# Patient Record
Sex: Male | Born: 1960 | Race: Black or African American | Hispanic: No | Marital: Single | Smoking: Never smoker
Health system: Southern US, Community
[De-identification: ages and names within clinical notes are randomized; demographics above are authoritative.]

## PROBLEM LIST (undated history)

## (undated) DIAGNOSIS — J309 Allergic rhinitis, unspecified: Secondary | ICD-10-CM

## (undated) DIAGNOSIS — D649 Anemia, unspecified: Secondary | ICD-10-CM

## (undated) DIAGNOSIS — I10 Essential (primary) hypertension: Secondary | ICD-10-CM

## (undated) HISTORY — PX: CHOLECYSTECTOMY: SHX55

## (undated) HISTORY — DX: Allergic rhinitis, unspecified: J30.9

## (undated) HISTORY — DX: Anemia, unspecified: D64.9

---

## 2005-04-02 ENCOUNTER — Other Ambulatory Visit: Payer: Self-pay

## 2005-04-02 ENCOUNTER — Inpatient Hospital Stay: Payer: Self-pay

## 2016-02-14 ENCOUNTER — Ambulatory Visit
Admission: RE | Admit: 2016-02-14 | Payer: BC Managed Care – PPO | Source: Ambulatory Visit | Admitting: Gastroenterology

## 2016-02-14 ENCOUNTER — Encounter: Admission: RE | Payer: Self-pay | Source: Ambulatory Visit

## 2016-02-14 SURGERY — COLONOSCOPY WITH PROPOFOL
Anesthesia: General

## 2020-05-30 ENCOUNTER — Other Ambulatory Visit: Payer: Self-pay

## 2020-05-30 ENCOUNTER — Ambulatory Visit
Admission: EM | Admit: 2020-05-30 | Discharge: 2020-05-30 | Disposition: A | Discharge status: Home / Self Care | Payer: BC Managed Care – PPO | Attending: Family Medicine | Admitting: Family Medicine

## 2020-05-30 DIAGNOSIS — R197 Diarrhea, unspecified: Secondary | ICD-10-CM | POA: Diagnosis not present

## 2020-05-30 HISTORY — DX: Essential (primary) hypertension: I10

## 2020-05-30 NOTE — Discharge Instructions (Addendum)
This will most likely resolve on its own with time.  Make sure you are staying hydrated.  You can do over-the-counter Imodium as needed. We will obtain a stool sample and tested for Covid to be sure Information given a packet on foods that help relieve diarrhea Follow up as needed for continued or worsening symptoms

## 2020-05-30 NOTE — ED Triage Notes (Signed)
Pt presents to Urgent Care with c/o diarrhea and chills x 2 days. Denies abdominal pain and N/V. Reports headache initially, but has subsided.

## 2020-05-30 NOTE — ED Provider Notes (Signed)
Thomas Powell    CSN: [id number] Arrival date & time: 05/30/20  0815      History   Chief Complaint Chief Complaint  Patient presents with   Diarrhea    HPI Thomas Powell. is a 59 y.o. male.   Patient is a 59 year old male with past medical history of hypertension.  He presents today with approximately 2 days of diarrhea, chills, headache.  The headache has resolved.  He denies any CT abdominal pain, nausea or vomiting.  Has had 3-4 episodes of loose stools/watery diarrhea daily.  No blood in stool.  No recent traveling or recent antibiotic use. No fever.      Past Medical History:  Diagnosis Date   Hypertension     There are no problems to display for this patient.   Past Surgical History:  Procedure Laterality Date   CHOLECYSTECTOMY         Home Medications    Prior to Admission medications   Medication Sig Start Date End Date Taking? Authorizing Provider  Multiple Vitamin (MULTIVITAMIN WITH MINERALS) TABS tablet Take 1 tablet by mouth daily.   Yes [provider]    Family History Family History  Problem Relation Age of Onset   Hypertension Mother    Anemia Father     Social History Social History   Tobacco Use   Smoking status: Never Smoker   Smokeless tobacco: Never Used  Substance Use Topics   Alcohol use: Not Currently   Drug use: Never     Allergies   Hctz [hydrochlorothiazide]   Review of Systems Review of Systems   Physical Exam Triage Vital Signs ED Triage Vitals  Enc Vitals Group     BP 05/30/20 0827 132/84     Pulse Rate 05/30/20 0827 (!) 102     Resp 05/30/20 0827 16     Temp 05/30/20 0827 98.2 F (36.8 C)     Temp Source 05/30/20 0827 Oral     SpO2 05/30/20 0827 96 %     Weight 05/30/20 0829 255 lb (115.7 kg)     Height 05/30/20 0829 6\' 1"  (1.854 m)     Head Circumference --      Peak Flow --      Pain Score 05/30/20 0830 0     Pain Loc --      Pain Edu? --      Excl. in GC? --     No data found.  Updated Vital Signs BP 132/84 (BP Location: Left Arm)    Pulse (!) 102    Temp 98.2 F (36.8 C) (Oral)    Resp 16    Ht 6\' 1"  (1.854 m)    Wt 255 lb (115.7 kg)    SpO2 96%    BMI 33.64 kg/m   Visual Acuity Right Eye Distance:   Left Eye Distance:   Bilateral Distance:    Right Eye Near:   Left Eye Near:    Bilateral Near:     Physical Exam Vitals and nursing note reviewed.  Constitutional:      General: He is not in acute distress.    Appearance: Normal appearance. He is not ill-appearing, toxic-appearing or diaphoretic.  HENT:     Head: Normocephalic and atraumatic.     Nose: Nose normal.  Eyes:     Conjunctiva/sclera: Conjunctivae normal.  Pulmonary:     Effort: Pulmonary effort is normal.  Musculoskeletal:        General: Normal range  of motion.     Cervical back: Normal range of motion.  Skin:    General: Skin is warm.  Neurological:     Mental Status: He is alert.  Psychiatric:        Mood and Affect: Mood normal.      UC Treatments / Results  Labs (all labs ordered are listed, but only abnormal results are displayed) Labs Reviewed - No data to display  EKG   Radiology No results found.  Procedures Procedures (including critical care time)  Medications Ordered in UC Medications - No data to display  Initial Impression / Assessment and Plan / UC Course  I have reviewed the triage vital signs and the nursing notes.  Pertinent labs & imaging results that were available during my care of the patient were reviewed by me and considered in my medical decision making (see chart for details).     Diarrhea Most likely viral versus something he ingested. Will obtain stool sample.  Will test for Covid. Recommend stay hydrated with water and Gatorade to replenish electrolytes. Recommended Imodium over-the-counter as needed Follow up as needed for continued or worsening symptoms  Final Clinical Impressions(s) / UC Diagnoses   Final  diagnoses:  Diarrhea of presumed infectious origin     Discharge Instructions     This will most likely resolve on its own with time.  Make sure you are staying hydrated.  You can do over-the-counter Imodium as needed. We will obtain a stool sample and tested for Covid to be sure Information given a packet on foods that help relieve diarrhea Follow up as needed for continued or worsening symptoms     ED Prescriptions    None     PDMP not reviewed this encounter.   Janace Aris, NP 05/30/20 [phone]

## 2020-05-31 LAB — GASTROINTESTINAL PANEL BY PCR, STOOL (REPLACES STOOL CULTURE)

## 2020-06-01 LAB — COVID-19, FLU A+B AND RSV
Influenza A, NAA: NOT DETECTED
Influenza B, NAA: NOT DETECTED
RSV, NAA: NOT DETECTED
SARS-CoV-2, NAA: NOT DETECTED

## 2021-12-10 ENCOUNTER — Other Ambulatory Visit: Payer: Self-pay | Admitting: Family Medicine

## 2021-12-10 DIAGNOSIS — R42 Dizziness and giddiness: Secondary | ICD-10-CM

## 2021-12-24 ENCOUNTER — Ambulatory Visit
Admission: RE | Admit: 2021-12-24 | Discharge: 2021-12-24 | Disposition: A | Discharge status: Home / Self Care | Payer: BC Managed Care – PPO | Source: Ambulatory Visit | Attending: Family Medicine | Admitting: Family Medicine

## 2021-12-24 DIAGNOSIS — R42 Dizziness and giddiness: Secondary | ICD-10-CM | POA: Insufficient documentation

## 2022-05-15 ENCOUNTER — Encounter: Payer: Self-pay | Admitting: Emergency Medicine

## 2022-05-15 ENCOUNTER — Other Ambulatory Visit: Payer: Self-pay

## 2022-05-15 ENCOUNTER — Emergency Department
Admission: EM | Admit: 2022-05-15 | Discharge: 2022-05-15 | Disposition: A | Discharge status: Home / Self Care | Payer: BC Managed Care – PPO | Attending: Emergency Medicine | Admitting: Emergency Medicine

## 2022-05-15 ENCOUNTER — Emergency Department: Payer: BC Managed Care – PPO

## 2022-05-15 DIAGNOSIS — R1012 Left upper quadrant pain: Secondary | ICD-10-CM | POA: Insufficient documentation

## 2022-05-15 DIAGNOSIS — I10 Essential (primary) hypertension: Secondary | ICD-10-CM | POA: Diagnosis not present

## 2022-05-15 DIAGNOSIS — M545 Low back pain, unspecified: Secondary | ICD-10-CM | POA: Insufficient documentation

## 2022-05-15 DIAGNOSIS — R109 Unspecified abdominal pain: Secondary | ICD-10-CM

## 2022-05-15 LAB — HEPATIC FUNCTION PANEL
ALT: 21 U/L (ref 0–44)
AST: 22 U/L (ref 15–41)
Albumin: 3.9 g/dL (ref 3.5–5.0)
Alkaline Phosphatase: 69 U/L (ref 38–126)
Bilirubin, Direct: <0.1 mg/dL (ref 0.0–0.2)
Total Bilirubin: 0.5 mg/dL (ref 0.3–1.2)
Total Protein: 6.8 g/dL (ref 6.5–8.1)

## 2022-05-15 LAB — URINALYSIS, ROUTINE W REFLEX MICROSCOPIC
Bilirubin Urine: NEGATIVE
Glucose, UA: NEGATIVE mg/dL
Hgb urine dipstick: NEGATIVE
Ketones, ur: NEGATIVE mg/dL
Leukocytes,Ua: NEGATIVE
Nitrite: NEGATIVE
Protein, ur: NEGATIVE mg/dL
Specific Gravity, Urine: 1.026 (ref 1.005–1.030)
pH: 6 (ref 5.0–8.0)

## 2022-05-15 LAB — CBC
HCT: 40.6 % (ref 39.0–52.0)
Hemoglobin: 13.2 g/dL (ref 13.0–17.0)
MCH: 24.3 pg — ABNORMAL LOW (ref 26.0–34.0)
MCHC: 32.5 g/dL (ref 30.0–36.0)
MCV: 74.6 fL — ABNORMAL LOW (ref 80.0–100.0)
Platelets: 195 10*3/uL (ref 150–400)
RBC: 5.44 MIL/uL (ref 4.22–5.81)
RDW: 13.7 % (ref 11.5–15.5)
WBC: 3.9 10*3/uL — ABNORMAL LOW (ref 4.0–10.5)
nRBC: 0 % (ref 0.0–0.2)

## 2022-05-15 LAB — BASIC METABOLIC PANEL
Anion gap: 7 (ref 5–15)
BUN: 20 mg/dL (ref 6–20)
CO2: 27 mmol/L (ref 22–32)
Calcium: 9.1 mg/dL (ref 8.9–10.3)
Chloride: 107 mmol/L (ref 98–111)
Creatinine, Ser: 1.35 mg/dL — ABNORMAL HIGH (ref 0.61–1.24)
GFR, Estimated: >60 mL/min (ref >60)
Glucose, Bld: 144 mg/dL — ABNORMAL HIGH (ref 70–99)
Potassium: 4.2 mmol/L (ref 3.5–5.1)
Sodium: 141 mmol/L (ref 135–145)

## 2022-05-15 LAB — LIPASE, BLOOD: Lipase: 26 U/L (ref 11–51)

## 2022-05-15 MED ORDER — CYCLOBENZAPRINE HCL 5 MG PO TABS: 5.0000 mg | ORAL_TABLET | Freq: Three times a day (TID) | ORAL | 0 refills | Status: DC | PRN

## 2022-05-15 MED ORDER — LIDOCAINE 5 % EX PTCH: 1.0000 | MEDICATED_PATCH | Freq: Two times a day (BID) | CUTANEOUS | 0 refills | Status: AC

## 2022-05-15 MED ORDER — KETOROLAC TROMETHAMINE 30 MG/ML IJ SOLN
15.0000 mg | Freq: Once | INTRAMUSCULAR | Status: AC
  Administered 2022-05-15: 15 mg via INTRAVENOUS
  Filled 2022-05-15: qty 1

## 2022-05-15 MED ORDER — LACTATED RINGERS IV BOLUS
1000.0000 mL | Freq: Once | INTRAVENOUS | Status: AC
  Administered 2022-05-15: 1000 mL via INTRAVENOUS

## 2022-05-15 NOTE — ED Notes (Signed)
Patient transported to CT 

## 2022-05-15 NOTE — ED Triage Notes (Signed)
Pt to ED via POV stating that he has been having stabbing back pain that radiates around into his left flank. Pt states that this has been going on for a week. Pt has taken OTC medications without relief. Pt states that he was seen at Urgent Care 2 days ago and had blood work done. Pt is in NAD.

## 2022-05-15 NOTE — ED Provider Notes (Signed)
[facility] Provider Note    Event Date/Time   First MD Initiated Contact with Patient 05/15/22 0801     (approximate)   History   Chief Complaint Back Pain and Flank Pain   HPI  Thomas Powell. is a 61 y.o. male with past medical history of hypertension and cholecystectomy who presents to the ED complaining of flank pain.  Patient reports that he was lifting weights about 1 week ago when he had onset of pain in his left lower back.  He describes the pain as sharp and stabbing, initially seemed to improve with over-the-counter medications.  He took a break from working out, however pain seemed to worsen over the course of this week.  Pain now seems to come from his left flank and radiated around to the left upper quadrant of his abdomen.  Pain is intermittent and not exacerbated or alleviated by anything in particular.  He denies any associated nausea or vomiting, has been having normal bowel movements.  He denies any dysuria, hematuria, or fevers.  He denies any history of similar symptoms, denies any history of kidney stones.     Physical Exam   Triage Vital Signs: ED Triage Vitals  Enc Vitals Group     BP 05/15/22 0800 (!) 157/86     Pulse Rate 05/15/22 0800 90     Resp 05/15/22 0800 16     Temp 05/15/22 0800 97.7 F (36.5 C)     Temp Source 05/15/22 0800 Oral     SpO2 05/15/22 0800 98 %     Weight --      Height --      Head Circumference --      Peak Flow --      Pain Score 05/15/22 0758 6     Pain Loc --      Pain Edu? --      Excl. in GC? --     Most recent vital signs: Vitals:   05/15/22 0800  BP: (!) 157/86  Pulse: 90  Resp: 16  Temp: 97.7 F (36.5 C)  SpO2: 98%    Constitutional: Alert and oriented. Eyes: Conjunctivae are normal. Head: Atraumatic. Nose: No congestion/rhinnorhea. Mouth/Throat: Mucous membranes are moist.  Cardiovascular: Normal rate, regular rhythm. Grossly normal heart sounds.  2+ radial pulses  bilaterally. Respiratory: Normal respiratory effort.  No retractions. Lungs CTAB. Gastrointestinal: Soft and nontender.  No CVA tenderness bilaterally.  No distention. Musculoskeletal: No lower extremity tenderness nor edema.  Neurologic:  Normal speech and language. No gross focal neurologic deficits are appreciated.    ED Results / Procedures / Treatments   Labs (all labs ordered are listed, but only abnormal results are displayed) Labs Reviewed  URINALYSIS, ROUTINE W REFLEX MICROSCOPIC - Abnormal; Notable for the following components:      Result Value   Color, Urine YELLOW (*)    APPearance CLEAR (*)    All other components within normal limits  BASIC METABOLIC PANEL - Abnormal; Notable for the following components:   Glucose, Bld 144 (*)    Creatinine, Ser 1.35 (*)    All other components within normal limits  CBC - Abnormal; Notable for the following components:   WBC 3.9 (*)    MCV 74.6 (*)    MCH 24.3 (*)    All other components within normal limits  HEPATIC FUNCTION PANEL  LIPASE, BLOOD   RADIOLOGY CT renal protocol reviewed and interpreted by me with no ureteral stone,  inflammatory changes, or focal fluid collection noted.  PROCEDURES:  Critical Care performed: No  Procedures   MEDICATIONS ORDERED IN ED: Medications  lactated ringers bolus 1,000 mL (1,000 mLs Intravenous New Bag/Given 05/15/22 0935)  ketorolac (TORADOL) 30 MG/ML injection 15 mg (15 mg Intravenous Given 05/15/22 0933)     IMPRESSION / MDM / ASSESSMENT AND PLAN / ED COURSE  I reviewed the triage vital signs and the nursing notes.                              61 y.o. male with past medical history of hypertension and cholecystectomy who presents to the ED complaining of about 1 week of intermittent sharp and stabbing pain in his left flank radiating towards the left upper quadrant of his abdomen.  Patient's presentation is most consistent with acute presentation with potential threat to life  or bodily function.  Differential diagnosis includes, but is not limited to, kidney stone, pyelonephritis, shingles, gastritis, muscle strain, radiculopathy.  Patient well-appearing and in no acute distress, vital signs are unremarkable.  I am not able to reproduce his pain in either his abdomen or costovertebral area.  Plan to further assess with CT imaging to rule out kidney stone or other intra-abdominal process.  No rash on exam to raise suspicion for shingles, will check urinalysis but he denies any urinary symptoms.  Plan to treat symptomatically with IV Toradol and reassess.  CT renal protocol is unremarkable, urinalysis shows no signs of infection.  Labs are also reassuring with no significant anemia, leukocytosis, electrolyte abnormality, or AKI.  LFTs and lipase are unremarkable.  Given reassuring work-up, suspect symptoms are due to muscle strain versus radiculopathy and he is appropriate for management as an outpatient.  He will be prescribed muscle relaxant and Lidoderm patches, was counseled to take Tylenol or ibuprofen as needed in addition to this.  He was counseled to follow-up with his PCP and to return to the ED for new or worsening symptoms, patient agrees with plan.      FINAL CLINICAL IMPRESSION(S) / ED DIAGNOSES   Final diagnoses:  Flank pain  Acute left-sided low back pain without sciatica     Rx / DC Orders   ED Discharge Orders          Ordered    lidocaine (LIDODERM) 5 %  Every 12 hours        05/15/22 1017    cyclobenzaprine (FLEXERIL) 5 MG tablet  3 times daily PRN        05/15/22 1017             Note:  This document was prepared using Dragon voice recognition software and may include unintentional dictation errors.   Blake Divine, MD 05/15/22 1019

## 2023-08-22 DIAGNOSIS — E119 Type 2 diabetes mellitus without complications: Secondary | ICD-10-CM | POA: Insufficient documentation

## 2023-08-22 DIAGNOSIS — I10 Essential (primary) hypertension: Secondary | ICD-10-CM | POA: Insufficient documentation

## 2023-08-22 NOTE — Progress Notes (Signed)
 Thomas Powell T. Kholton Coate, MD, CAQ Sports Medicine [facility] [address] [place], [address]  Phone: [phone]  FAX: [phone]  Thomas Powell. - 63 y.o. male  MRN [id number]  Date of Birth: [birth year]  Date: 08/23/2023  PCP: Thomas San, MD  Referral: Thomas San, MD  Chief Complaint  Patient presents with   Establish Care   Arm Pain    Left    Subjective:   Thomas Powell. is a 62 y.o. very pleasant male patient with Body mass index is 33.98 kg/m. who presents with the following:  He is here for a new patient appointment.  It is not clear to me if he is here for a new sports medicine consult or a new patient primary care visit.  He is not sure.  He may return to [place] [facility].  At this point, he has a deep dull ache in the shoulder that is been present for roughly 2 to 3 months.  He has restriction of motion, pain at night, and globally some pain from the shoulder blade all the way down to the elbow.    Relevant history includes type 2 diabetes.  He thought that he had prediabetes.  I reviewed his lab work with him.  Has worked out for a long time.  Normally will do weights Treadmill for an extended period of time.   About a month or so ago, would get home, and feel some pain with ABD and IROM  Shoulder to upper arm Did not do anything that he can think of it to make it worse Will do some hangs, cannot do that at all right now Cannot really sleep on the L arm He cannot do any pressing, bench press, overhead pressing  No injury at all  Review of Systems is noted in the HPI, as appropriate  Patient Active Problem List   Diagnosis Date Noted   Type 2 diabetes mellitus without complication, without long-term current use of insulin (HCC) 08/22/2023   Essential hypertension 08/22/2023    Past Medical History:  Diagnosis Date   Allergic rhinitis    Anemia    Essential hypertension 08/22/2023   Type 2  diabetes mellitus without complication, without long-term current use of insulin (HCC) 08/22/2023    Past Surgical History:  Procedure Laterality Date   CHOLECYSTECTOMY      Family History  Problem Relation Age of Onset   Hypertension Mother    Anemia Father    Diabetes Maternal Aunt    Diabetes Sister    Drug abuse Brother     Social History   Social History Narrative   Not on file     Objective:   BP (!) 126/90 (BP Location: Right Arm, Patient Position: Sitting, Cuff Size: Large)   Pulse 93   Temp 97.6 F (36.4 C) (Temporal)   Ht 5' 11.25" (1.81 m)   Wt 245 lb 6 oz (111.3 kg)   SpO2 93%   BMI 33.98 kg/m   GEN: No acute distress; alert,appropriate. PULM: Breathing comfortably in no respiratory distress PSYCH: Normally interactive.    Shoulder: R and L Inspection: No muscle wasting or winging Ecchymosis/edema: neg  AC joint, scapula, clavicle: NT Cervical spine: NT, full ROM Spurling's: neg ABNORMAL SIDE TESTED: L UNLESS OTHERWISE NOTED, THE CONTRALATERAL SIDE HAS FULL RANGE OF MOTION. Abduction: 5/5, LIMITED TO 110 DEGREES Flexion: 5/5, LIMITED TO 110 DEGNO ROM  IR, lift-off: 5/5. TESTED AT 90 DEGREES  OF ABDUCTION, LIMITED TO 0 DEGREES ER at neutral:  5/5, TESTED AT 90 DEGREES OF ABDUCTION, LIMITED TO 10 DEGREES AC crossover and compression: PAIN Drop Test: neg Empty Can: neg Supraspinatus insertion: NT Bicipital groove: NT ALL OTHER SPECIAL TESTING EQUIVOCAL GIVEN LOSS OF MOTION C5-T1 intact Sensation intact Grip 5/5    Laboratory and Imaging Data:  Assessment and Plan:     ICD-10-CM   1. 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 Type 2 diabetes mellitus without complication, without long-term current use of insulin (HCC)  E11.9     3. Essential hypertension  I10      Patient was given a systematic ROM protocol from [employer] to be done daily. Emphasized adherence and recommended formal PT.  Tylenol or NSAID of choice prn for pain  relief  Patient will be sent for formal PT for aggressive frozen shoulder ROM if sx persist. Will need RTC str and scapular stabilization to fix underlying mechanics.  Intraarticular corticosteroid injections in Adhesive Capsulitis have clearly been shown to be of benefit.   He also has diabetes and hypertension.  We had an extensive conversation about primary care management.  He now has Medicaid.  He is really not all that clear whether or not he will want to come to our office or remain with Dr. Dean Powell, his primary care doctor.  I am going to leave this up to the patient and he can sort that out at a later time   Disposition: Return in about 2 months (around 10/21/2023) for Dr. Geralyn Knee, frozen shoulder follow-up.  [employer] speech-to-text software was used for transcription in this dictation.  Possible transcriptional errors can occur using Animal nutritionist.   Signed,  Thomas Bye. Myla Mauriello, MD   Outpatient Encounter Medications as of 08/23/2023  Medication Sig   ALPHA LIPOIC ACID PO Take by mouth.   ASHWAGANDHA PO Take by mouth.   aspirin EC 325 MG tablet Take 0.25 tablets by mouth daily.   Cholecalciferol 125 MCG (5000 UT) TABS Take 1 tablet by mouth daily.   clotrimazole-betamethasone (LOTRISONE) cream Apply 1 Application topically 2 (two) times daily as needed.   Glutathione (GLUTATHIONE REDUCED) 500 MG CAPS Take 1 Capful by mouth daily.   Omega-3 Fatty Acids (OMEGA 3 PO) Take 2 capsules by mouth daily.   [DISCONTINUED] cyclobenzaprine  (FLEXERIL ) 5 MG tablet Take 1 tablet (5 mg total) by mouth 3 (three) times daily as needed.   [DISCONTINUED] Multiple Vitamin (MULTIVITAMIN WITH MINERALS) TABS tablet Take 1 tablet by mouth daily.   No facility-administered encounter medications on file as of 08/23/2023.

## 2023-08-23 ENCOUNTER — Encounter: Payer: Self-pay | Admitting: Family Medicine

## 2023-08-23 ENCOUNTER — Ambulatory Visit (INDEPENDENT_AMBULATORY_CARE_PROVIDER_SITE_OTHER): Payer: Medicaid Other | Admitting: Family Medicine

## 2023-08-23 VITALS — BP 126/90 | HR 93 | Temp 97.6°F | Ht 71.25 in | Wt 245.4 lb

## 2023-08-23 DIAGNOSIS — M7502 Adhesive capsulitis of left shoulder: Secondary | ICD-10-CM | POA: Diagnosis not present

## 2023-08-23 DIAGNOSIS — E119 Type 2 diabetes mellitus without complications: Secondary | ICD-10-CM

## 2023-08-23 DIAGNOSIS — I10 Essential (primary) hypertension: Secondary | ICD-10-CM

## 2024-05-03 IMAGING — CT CT HEAD W/O CM
4 series · 14 of 47 positions shown, 16 images · non-contrast
Comparison: None Available.

CLINICAL DATA: Dizziness



[Series 2: axial st head 5.00 ax · axial · 0.37mm/px · z∈[-579,-479]mm · 7 of 29 slices shown, 9 images]
[im 4/29  brain]
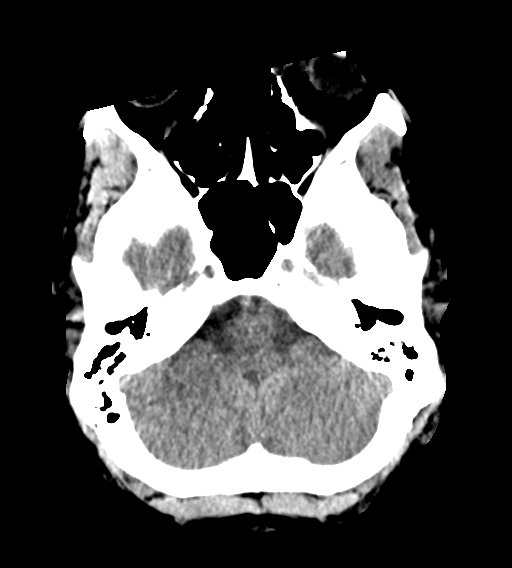
[im 4/29  bone]
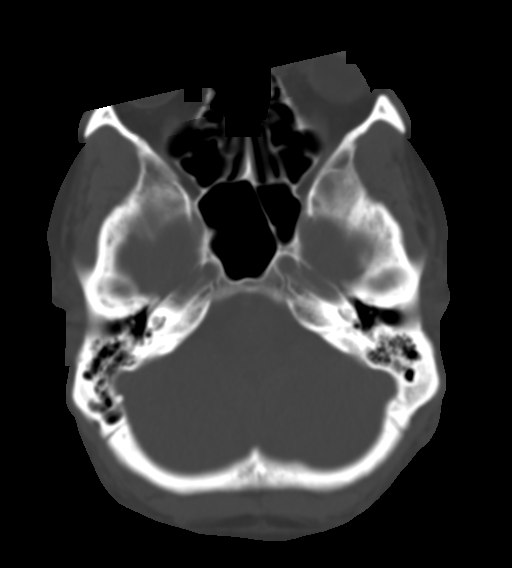
[im 8/29  brain]
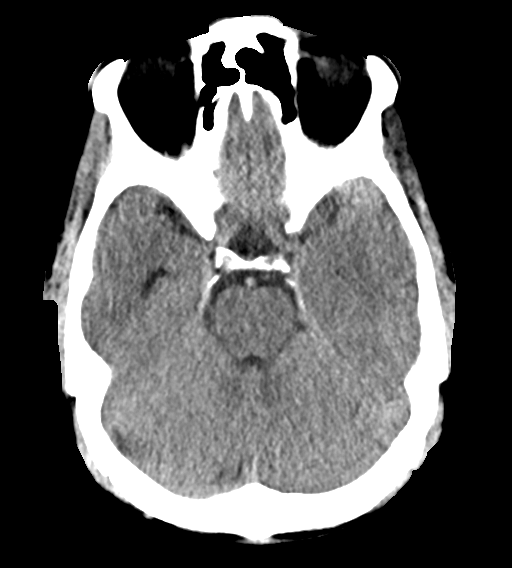
[im 11/29  brain]
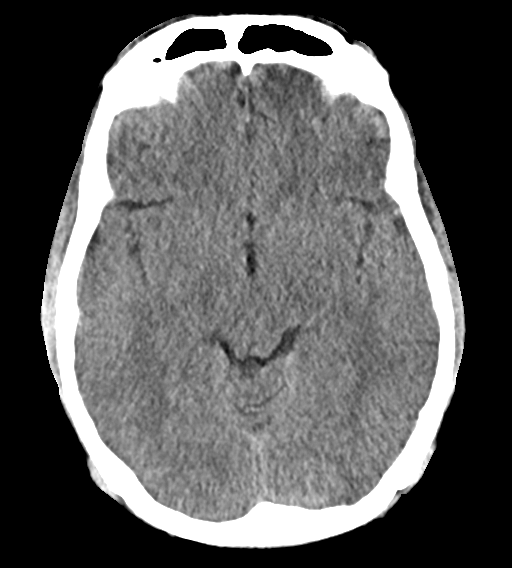
[im 15/29  brain]
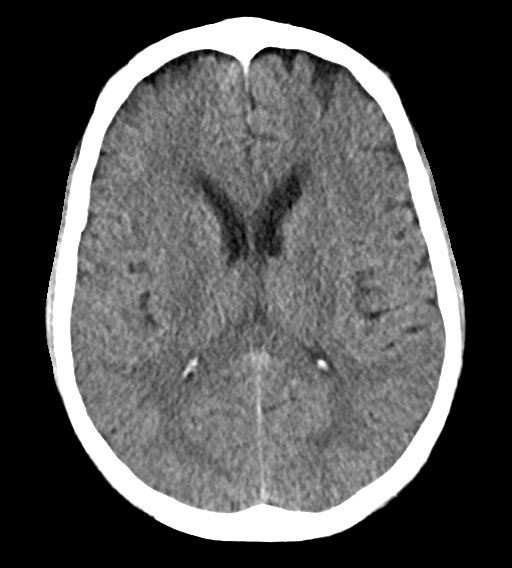
[im 18/29  brain]
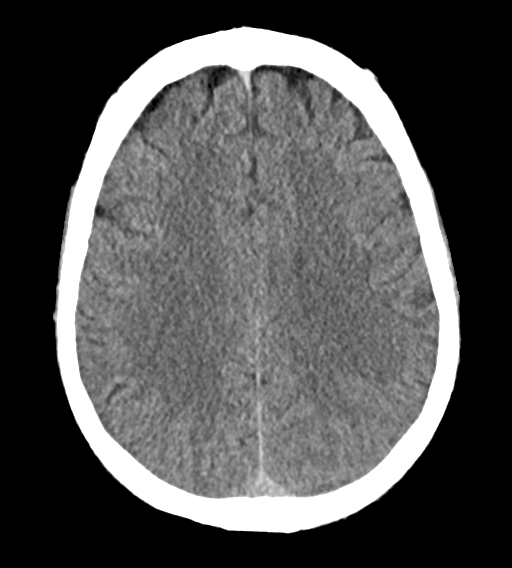
[im 18/29  bone]
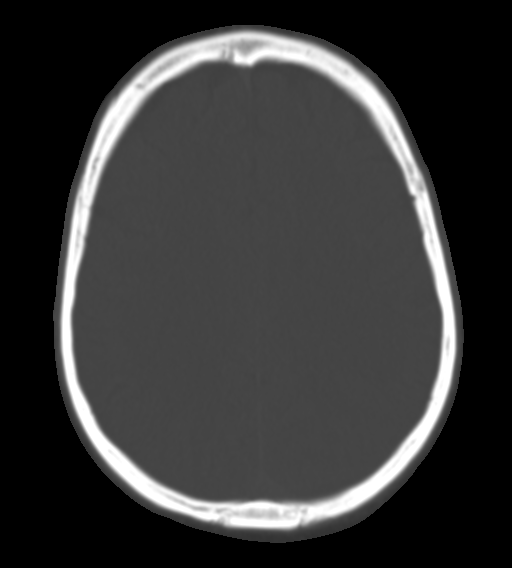
[im 22/29  brain]
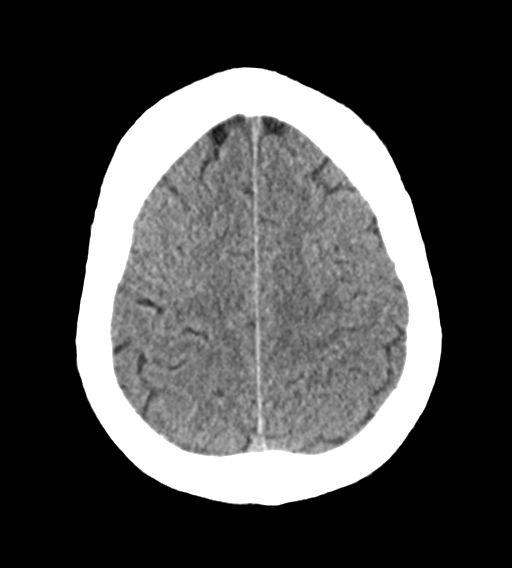
[im 25/29  brain]
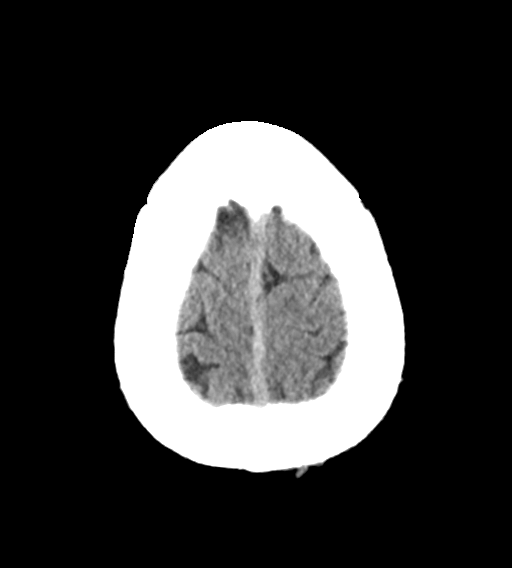

[Series 4: bone windows head 2.00 ax · axial · 0.37mm/px · 1 of 72 slices shown]
[im 8/72  bone]
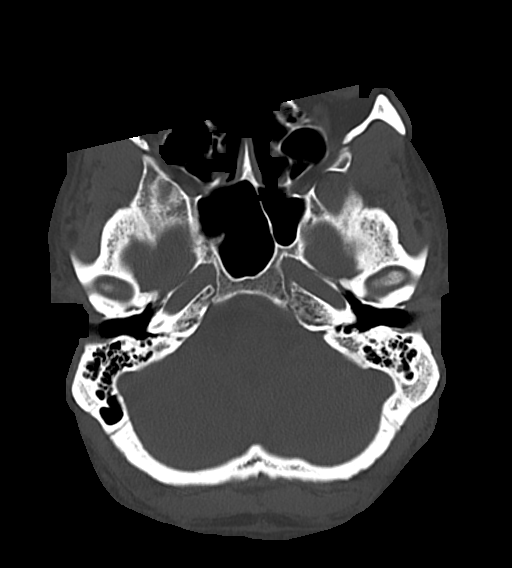

[Series 6: coronals head 3.00 cor · coronal · 0.28mm/px · 3 of 69 slices shown]
[im 23/69  brain]
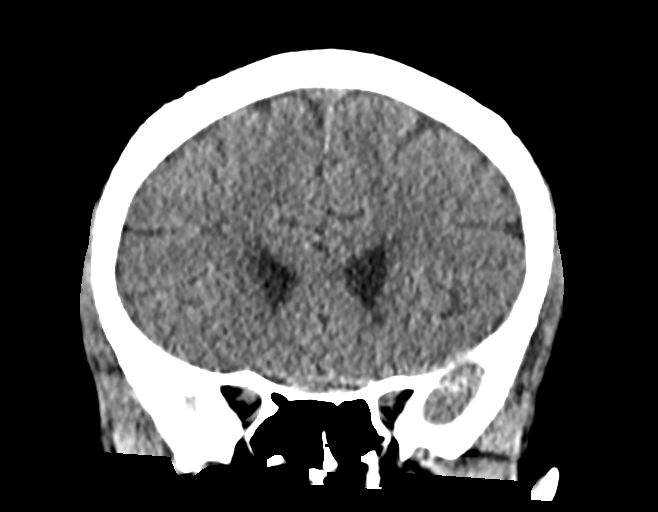
[im 31/69  brain]
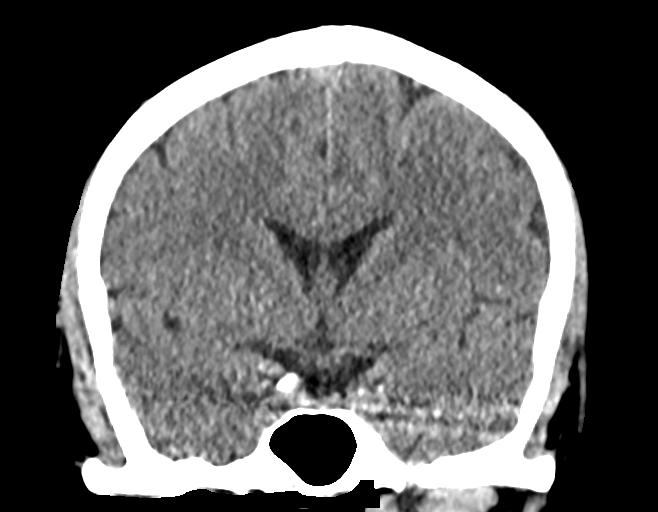
[im 38/69  brain]
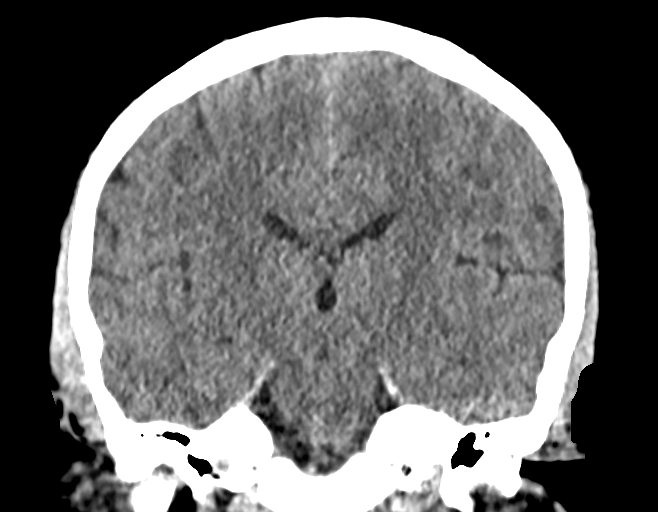

[Series 8: sagittals head 3.00 sag · sagittal · 0.28mm/px · 3 of 62 slices shown]
[im 21/62  brain]
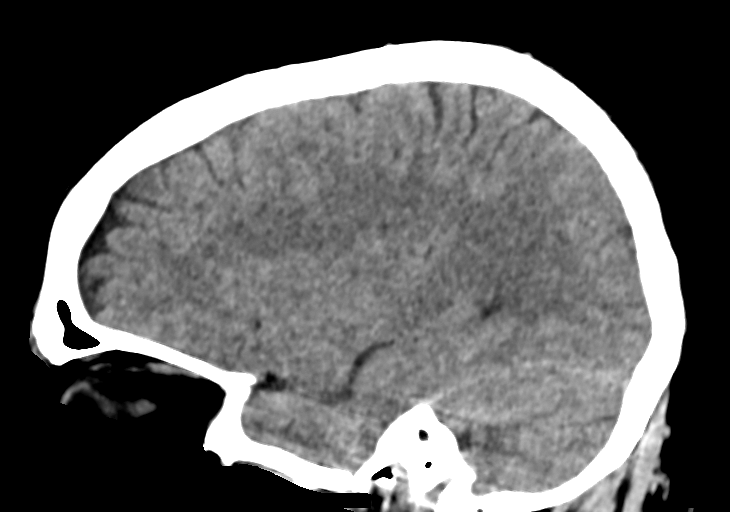
[im 31/62  brain]
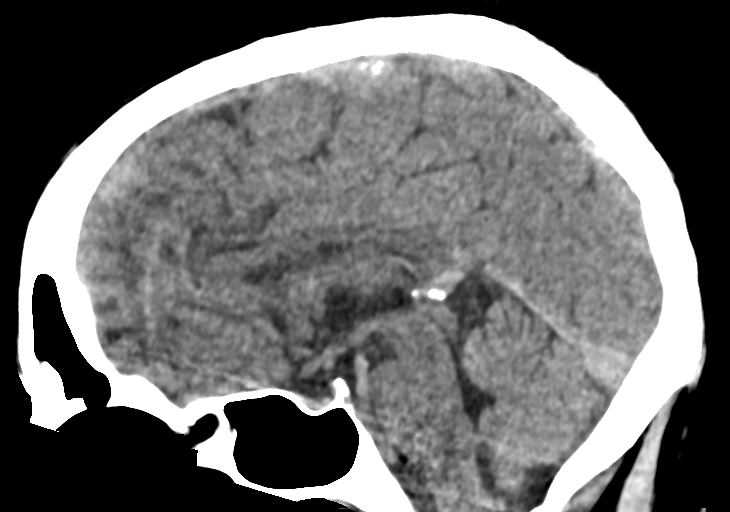
[im 41/62  brain]
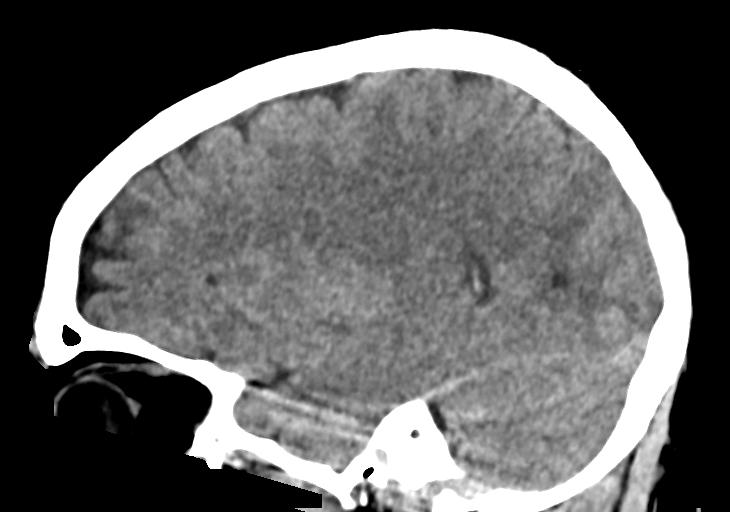

[14 of 47 positions shown; findings below may reference images not displayed]

FINDINGS: Brain: No acute intracranial hemorrhage, mass effect, or herniation.
No extra-axial fluid collections. No evidence of acute territorial
infarct. No hydrocephalus.

Vascular: No hyperdense vessel or unexpected calcification.

Skull: Normal. Negative for fracture or focal lesion.

Sinuses/Orbits: No acute finding.

Other: None.
IMPRESSION: No acute intracranial process identified.

## 2024-05-22 ENCOUNTER — Other Ambulatory Visit: Payer: Self-pay | Admitting: Family Medicine

## 2024-05-22 DIAGNOSIS — Z0000 Encounter for general adult medical examination without abnormal findings: Secondary | ICD-10-CM
